# Patient Record
Sex: Female | Born: 1985 | Race: White | Hispanic: No | Marital: Single
Health system: Southern US, Community
[De-identification: ages and names within clinical notes are randomized; demographics above are authoritative.]

---

## 2010-07-01 ENCOUNTER — Encounter: Admission: RE | Admit: 2010-07-01 | Discharge: 2010-07-01 | Payer: Self-pay | Admitting: Family Medicine

## 2010-07-06 ENCOUNTER — Encounter: Admission: RE | Admit: 2010-07-06 | Discharge: 2010-07-06 | Payer: Self-pay | Admitting: Gastroenterology

## 2010-09-25 ENCOUNTER — Emergency Department (HOSPITAL_COMMUNITY)
Admission: EM | Admit: 2010-09-25 | Discharge: 2010-09-25 | Payer: Self-pay | Source: Home / Self Care | Admitting: Emergency Medicine
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
## 2019-12-21 ENCOUNTER — Ambulatory Visit: Payer: Self-pay | Attending: Internal Medicine

## 2019-12-21 DIAGNOSIS — Z23 Encounter for immunization: Secondary | ICD-10-CM | POA: Insufficient documentation

## 2019-12-21 NOTE — Progress Notes (Signed)
   Covid-19 Vaccination Clinic  Name:  Tracey James    MRN: [id number] DOB: 23-Sep-1986  12/21/2019  [name] was observed post Covid-19 immunization for 15 minutes without incidence. She was provided with Vaccine Information Sheet and instruction to access the V-Safe system.   [name] was instructed to call 911 with any severe reactions post vaccine: Marland Kitchen Difficulty breathing  . Swelling of your face and throat  . A fast heartbeat  . A bad rash all over your body  . Dizziness and weakness    Immunizations Administered    Name Date Dose VIS Date Route   Pfizer COVID-19 Vaccine 12/21/2019 12:37 PM 0.3 mL 10/04/2019 Intramuscular   Manufacturer: [employer], [employer]   Lot: [id number]   NDC: [address]-6

## 2020-01-11 ENCOUNTER — Ambulatory Visit: Payer: Self-pay | Attending: Internal Medicine

## 2020-01-11 DIAGNOSIS — Z23 Encounter for immunization: Secondary | ICD-10-CM

## 2020-01-11 NOTE — Progress Notes (Signed)
   Covid-19 Vaccination Clinic  Name:  Tracey James    MRN: [id number] DOB: Feb 04, 1986  01/11/2020  Ms. Tracey James was observed post Covid-19 immunization for 30 minutes based on pre-vaccination screening without incident. She was provided with Vaccine Information Sheet and instruction to access the V-Safe system.   Ms. Tracey James was instructed to call 911 with any severe reactions post vaccine: Marland Kitchen Difficulty breathing  . Swelling of face and throat  . A fast heartbeat  . A bad rash all over body  . Dizziness and weakness   Immunizations Administered    Name Date Dose VIS Date Route   Pfizer COVID-19 Vaccine 01/11/2020 11:18 AM 0.3 mL 10/04/2019 Intramuscular   Manufacturer: [employer], [employer]   Lot: [id number]   NDC: [address]-6

## 2020-01-15 ENCOUNTER — Ambulatory Visit: Payer: Self-pay

## 2020-04-08 ENCOUNTER — Other Ambulatory Visit (HOSPITAL_COMMUNITY)
Admission: RE | Admit: 2020-04-08 | Discharge: 2020-04-08 | Disposition: A | Discharge status: Home / Self Care | Payer: BC Managed Care – PPO | Source: Ambulatory Visit | Attending: Family Medicine | Admitting: Family Medicine

## 2020-04-08 DIAGNOSIS — Z124 Encounter for screening for malignant neoplasm of cervix: Secondary | ICD-10-CM | POA: Insufficient documentation

## 2020-04-14 LAB — CYTOLOGY - PAP
Comment: NEGATIVE
Diagnosis: NEGATIVE
High risk HPV: NEGATIVE

## 2020-07-08 ENCOUNTER — Other Ambulatory Visit: Payer: BC Managed Care – PPO
